# Patient Record
Sex: Male | Born: 2000 | Race: Black or African American | Hispanic: No | Marital: Single | Smoking: Never smoker
Health system: Southern US, Community
[De-identification: ages and names within clinical notes are randomized; demographics above are authoritative.]

## PROBLEM LIST (undated history)

## (undated) DIAGNOSIS — J40 Bronchitis, not specified as acute or chronic: Secondary | ICD-10-CM

## (undated) DIAGNOSIS — S060XAA Concussion with loss of consciousness status unknown, initial encounter: Secondary | ICD-10-CM

## (undated) DIAGNOSIS — K219 Gastro-esophageal reflux disease without esophagitis: Secondary | ICD-10-CM

## (undated) DIAGNOSIS — S060X9A Concussion with loss of consciousness of unspecified duration, initial encounter: Secondary | ICD-10-CM

## (undated) HISTORY — PX: TONSILLECTOMY: SUR1361

## (undated) HISTORY — PX: ADENOIDECTOMY: SUR15

---
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
## 2021-03-14 ENCOUNTER — Encounter (HOSPITAL_BASED_OUTPATIENT_CLINIC_OR_DEPARTMENT_OTHER): Payer: Self-pay | Admitting: *Deleted

## 2021-03-14 ENCOUNTER — Emergency Department (HOSPITAL_BASED_OUTPATIENT_CLINIC_OR_DEPARTMENT_OTHER)
Admission: EM | Admit: 2021-03-14 | Discharge: 2021-03-14 | Disposition: A | Discharge status: Home / Self Care | Payer: Medicaid Other | Attending: Emergency Medicine | Admitting: Emergency Medicine

## 2021-03-14 ENCOUNTER — Other Ambulatory Visit: Payer: Self-pay

## 2021-03-14 DIAGNOSIS — H6122 Impacted cerumen, left ear: Secondary | ICD-10-CM | POA: Diagnosis not present

## 2021-03-14 DIAGNOSIS — H938X2 Other specified disorders of left ear: Secondary | ICD-10-CM | POA: Diagnosis present

## 2021-03-14 NOTE — ED Provider Notes (Signed)
[facility] EMERGENCY DEPARTMENT Provider Note   CSN: [id number] Arrival date & time: 03/14/21  2138     History Chief Complaint  Patient presents with   Hearing Problem    Darryl Holmes is a 20 y.o. male.  The history is provided by the patient. No language interpreter was used.  Ear Fullness This is a new problem. The current episode started less than 1 hour ago. The problem occurs constantly. Nothing aggravates the symptoms. Nothing relieves the symptoms. He has tried nothing for the symptoms.    Pt reports ear began feeling stopped up tonight.  Pt reports decreased hearing left ear.  Past Medical History:  Diagnosis Date   Bronchitis    Concussion    GERD (gastroesophageal reflux disease)     Patient Active Problem List   Diagnosis Date Noted   Injury of right toe 03/14/2015    Past Surgical History:  Procedure Laterality Date   ADENOIDECTOMY     TONSILLECTOMY         No family history on file.  Social History   Tobacco Use   Smoking status: Never    Passive exposure: Yes   Smokeless tobacco: Never  Vaping Use   Vaping Use: Never used  Substance Use Topics   Alcohol use: Not Currently    Alcohol/week: 0.0 standard drinks   Drug use: Not Currently    Types: Marijuana    Home Medications Prior to Admission medications   Medication Sig Start Date End Date Taking? Authorizing Provider  ARIPiprazole (ABILIFY) 5 MG tablet Take 1 tablet (5 mg total) by mouth daily. 06/29/20   Niel Hummer, MD  cephALEXin (KEFLEX) 500 MG capsule Take 1 capsule (500 mg total) by mouth 4 (four) times daily. 12/02/20   Cathren Laine, MD  famotidine (PEPCID) 40 MG tablet Take 1 tablet (40 mg total) by mouth daily. 11/18/20   Terrilee Files, MD  ondansetron (ZOFRAN ODT) 4 MG disintegrating tablet Take 1 tablet (4 mg total) by mouth every 8 (eight) hours as needed for nausea or vomiting. 11/18/20   Terrilee Files, MD    Allergies    Patient has no known  allergies.  Review of Systems   Review of Systems  All other systems reviewed and are negative.  Physical Exam Updated Vital Signs BP 127/82 (BP Location: Right Arm)   Pulse 80   Temp 98.6 F (37 C) (Oral)   Resp 16   Ht 5\' 8"  (1.727 m)   Wt 82.1 kg   SpO2 100%   BMI 27.52 kg/m   Physical Exam Vitals reviewed.  HENT:     Left Ear: There is impacted cerumen.     Mouth/Throat:     Mouth: Mucous membranes are moist.  Cardiovascular:     Rate and Rhythm: Normal rate.  Pulmonary:     Effort: Pulmonary effort is normal.  Skin:    General: Skin is warm.  Neurological:     General: No focal deficit present.     Mental Status: He is alert.  Psychiatric:        Mood and Affect: Mood normal.    ED Results / Procedures / Treatments   Labs (all labs ordered are listed, but only abnormal results are displayed) Labs Reviewed - No data to display  EKG None  Radiology No results found.  Procedures Procedures   Medications Ordered in ED Medications - No data to display  ED Course  I have reviewed the triage  vital signs and the nursing notes.  Pertinent labs & imaging results that were available during my care of the patient were reviewed by me and considered in my medical decision making (see chart for details).    MDM Rules/Calculators/A&P                         MDM:  Ear wax irrigated out by Rn.  Pt reports relief of symptoms  Final Clinical Impression(s) / ED Diagnoses Final diagnoses:  Impacted cerumen of left ear    Rx / DC Orders ED Discharge Orders     None     An After Visit Summary was printed and given to the patient.    Elson Areas, Cordelia Poche 03/14/21 2355    Maia Plan, MD 03/17/21 [phone]
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
## 2021-05-13 ENCOUNTER — Encounter (HOSPITAL_BASED_OUTPATIENT_CLINIC_OR_DEPARTMENT_OTHER): Payer: Self-pay | Admitting: *Deleted

## 2021-05-13 ENCOUNTER — Emergency Department (HOSPITAL_BASED_OUTPATIENT_CLINIC_OR_DEPARTMENT_OTHER): Payer: Medicaid Other

## 2021-05-13 ENCOUNTER — Emergency Department (HOSPITAL_BASED_OUTPATIENT_CLINIC_OR_DEPARTMENT_OTHER)
Admission: EM | Admit: 2021-05-13 | Discharge: 2021-05-14 | Disposition: A | Discharge status: Home / Self Care | Payer: Medicaid Other | Attending: Emergency Medicine | Admitting: Emergency Medicine

## 2021-05-13 ENCOUNTER — Other Ambulatory Visit: Payer: Self-pay

## 2021-05-13 DIAGNOSIS — R112 Nausea with vomiting, unspecified: Secondary | ICD-10-CM | POA: Insufficient documentation

## 2021-05-13 DIAGNOSIS — W228XXA Striking against or struck by other objects, initial encounter: Secondary | ICD-10-CM | POA: Insufficient documentation

## 2021-05-13 DIAGNOSIS — S3022XA Contusion of scrotum and testes, initial encounter: Secondary | ICD-10-CM | POA: Diagnosis not present

## 2021-05-13 DIAGNOSIS — N451 Epididymitis: Secondary | ICD-10-CM | POA: Insufficient documentation

## 2021-05-13 DIAGNOSIS — S3994XA Unspecified injury of external genitals, initial encounter: Secondary | ICD-10-CM | POA: Diagnosis present

## 2021-05-13 MED ORDER — HYDROMORPHONE HCL 1 MG/ML IJ SOLN
0.5000 mg | Freq: Once | INTRAMUSCULAR | Status: AC
  Administered 2021-05-13: 0.5 mg via INTRAVENOUS
  Filled 2021-05-13: qty 1

## 2021-05-13 MED ORDER — SODIUM CHLORIDE 0.9 % IV BOLUS
1000.0000 mL | Freq: Once | INTRAVENOUS | Status: AC
  Administered 2021-05-13: 1000 mL via INTRAVENOUS

## 2021-05-13 MED ORDER — ONDANSETRON HCL 4 MG/2ML IJ SOLN
4.0000 mg | Freq: Once | INTRAMUSCULAR | Status: AC
  Administered 2021-05-13: 4 mg via INTRAVENOUS
  Filled 2021-05-13: qty 2
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
## 2021-05-13 NOTE — ED Provider Notes (Signed)
[facility] EMERGENCY DEPARTMENT Provider Note   CSN: [id number] Arrival date & time: 05/13/21  2051     History Chief Complaint  Patient presents with   Testicle Injury    Darryl Holmes is a 20 y.o. male.  Patient presents to the emergency department for evaluation of left sided testicular pain and swelling starting about 12 hours ago.  Patient states that he was struck with a "trash stick" that is used to pick up garbage.  He states that he thinks he "fractured" his testicle.  Patient has pain and nausea with vomiting.  No treatments prior to arrival.  Denies other injury.  He has urinated without difficulty. The onset of this condition was acute. The course is constant. Aggravating factors: none. Alleviating factors: none.        Past Medical History:  Diagnosis Date   Bronchitis    Concussion    GERD (gastroesophageal reflux disease)     Patient Active Problem List   Diagnosis Date Noted   Injury of right toe 03/14/2015    Past Surgical History:  Procedure Laterality Date   ADENOIDECTOMY     TONSILLECTOMY         History reviewed. No pertinent family history.  Social History   Tobacco Use   Smoking status: Never    Passive exposure: Yes   Smokeless tobacco: Never  Vaping Use   Vaping Use: Never used  Substance Use Topics   Alcohol use: Not Currently    Alcohol/week: 0.0 standard drinks   Drug use: Not Currently    Types: Marijuana    Home Medications Prior to Admission medications   Medication Sig Start Date End Date Taking? Authorizing Provider  ARIPiprazole (ABILIFY) 5 MG tablet Take 1 tablet (5 mg total) by mouth daily. 06/29/20   Niel Hummer, MD  cephALEXin (KEFLEX) 500 MG capsule Take 1 capsule (500 mg total) by mouth 4 (four) times daily. 12/02/20   Cathren Laine, MD  famotidine (PEPCID) 40 MG tablet Take 1 tablet (40 mg total) by mouth daily. 11/18/20   Terrilee Files, MD  ondansetron (ZOFRAN ODT) 4 MG disintegrating tablet Take 1  tablet (4 mg total) by mouth every 8 (eight) hours as needed for nausea or vomiting. 11/18/20   Terrilee Files, MD    Allergies    Patient has no known allergies.  Review of Systems   Review of Systems  Constitutional:  Negative for fever.  HENT:  Negative for rhinorrhea and sore throat.   Eyes:  Negative for redness.  Respiratory:  Negative for cough.   Cardiovascular:  Negative for chest pain.  Gastrointestinal:  Positive for nausea and vomiting. Negative for abdominal pain and diarrhea.  Genitourinary:  Positive for scrotal swelling and testicular pain. Negative for dysuria and hematuria.  Musculoskeletal:  Negative for myalgias.  Skin:  Negative for rash.  Neurological:  Negative for headaches.   Physical Exam Updated Vital Signs BP (!) 153/94 (BP Location: Right Arm)   Pulse 84   Temp 99.3 F (37.4 C) (Oral)   Resp 16   Ht 5\' 7"  (1.702 m)   Wt 78.5 kg   SpO2 100%   BMI 27.10 kg/m   Physical Exam Vitals and nursing note reviewed.  Constitutional:      General: He is in acute distress.     Appearance: He is well-developed.     Comments: Patient actively vomiting during exam  HENT:     Head: Normocephalic and atraumatic.  Eyes:  General:        Right eye: No discharge.        Left eye: No discharge.     Conjunctiva/sclera: Conjunctivae normal.  Cardiovascular:     Rate and Rhythm: Normal rate and regular rhythm.     Heart sounds: Normal heart sounds.  Pulmonary:     Effort: Pulmonary effort is normal.     Breath sounds: Normal breath sounds.  Abdominal:     Palpations: Abdomen is soft.     Tenderness: There is no abdominal tenderness.  Genitourinary:    Penis: Uncircumcised.      Testes:        Right: Tenderness or swelling not present.        Left: Tenderness and swelling present.  Musculoskeletal:     Cervical back: Normal range of motion and neck supple.  Skin:    General: Skin is warm and dry.  Neurological:     Mental Status: He is alert.     ED Results / Procedures / Treatments   Labs (all labs ordered are listed, but only abnormal results are displayed) Labs Reviewed  URINALYSIS, ROUTINE W REFLEX MICROSCOPIC    EKG None  Radiology US SCROTUM W/DOPPLER  Result Date: 05/13/2021 CLINICAL DATA:  Scrotal injury EXAM: SCROTAL ULTRASOUND DOPPLER ULTRASOUND OF THE TESTICLES TECHNIQUE: Complete ultrasound examination of the testicles, epididymis, and other scrotal structures was performed. Color and spectral Doppler ultrasound were also utilized to evaluate blood flow to the testicles. COMPARISON:  None. FINDINGS: Right testicle Measurements: 4.4 x 2 x 2.5 cm. No mass. Microlithiasis visualized. Left testicle Measurements: 4.5 x 2.2 x 2.6 cm. No mass. Microlithiasis visualized. Right epididymis:  Normal in size and appearance. Left epididymis: Enlarged heterogeneous epididymis with associated increased vascularity. Hydrocele:  None visualized. Varicocele:  None visualized. Pulsed Doppler interrogation of both testes demonstrates normal low resistance arterial and venous waveforms bilaterally. IMPRESSION: 1. Findings suggestive of left epididymitis.  No abscess formation. 2. No definite testicular trauma. 3. 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 Monthly testicular self-examination and annual physical exams are considered appropriate surveillance. If patient has other risk factors for testicular carcinoma, then referral to Urology should be considered. (Reference: DeCastro, et al.: A 5-Year Follow up Study of Asymptomatic Men with Testicular Microlithiasis. J Urol 2008; 179:1420-1423.) Electronically Signed   By: Tish Frederickson M.D.   On: 05/13/2021 22:19    Procedures Procedures   Medications Ordered in ED Medications  ondansetron (ZOFRAN) injection  4 mg (has no administration in time range)  HYDROmorphone (DILAUDID) injection 0.5 mg (has no administration in time range)  sodium chloride 0.9 % bolus 1,000 mL (has no administration in time range)    ED Course  I have reviewed the triage vital signs and the nursing notes.  Pertinent labs & imaging results that were available during my care of the patient were reviewed by me and considered in my medical decision making (see chart for details).  Patient seen and examined. Work-up initiated. Medications ordered. Actively vomiting.   Vital signs reviewed and are as follows: BP (!) 153/94 (BP Location: Right Arm)   Pulse 84   Temp 99.3 F (37.4 C) (Oral)   Resp 16   Ht 5\' 7"  (1.702 m)   Wt 78.5 kg   SpO2 100%   BMI 27.10 kg/m  Pt receiving IV fluids.  Appears more comfortable now.  Will need p.o. challenge.  Plan for discharged home with urology follow-up, ODT Zofran, ibuprofen.  Patient urged to return with worsening symptoms or other concerns. Patient verbalized understanding and agrees with plan.    MDM Rules/Calculators/A&P                           Patient with scrotal injury --likely traumatic epididymitis.  Pain uncontrolled on arrival with active vomiting.  Ultrasound did not demonstrate torsion or large hematoma.  He does have left epididymal swelling, likely posttraumatic.  Plan for discharged home with follow-up after symptoms are controlled.    Final Clinical Impression(s) / ED Diagnoses Final diagnoses:  Contusion of scrotum and testes, initial encounter  Epididymitis    Rx / DC Orders ED Discharge Orders          Ordered    ibuprofen (ADVIL) 800 MG tablet  3 times daily        05/14/21 0022    ondansetron (ZOFRAN ODT) 4 MG disintegrating tablet  Every 8 hours PRN        05/14/21 0022    Ambulate with assistance  Status:  Canceled        05/13/21 2359             Renne Crigler, PA-C 05/14/21 0024    Vanetta Mulders, MD 05/17/21 2304

## 2021-05-13 NOTE — ED Notes (Signed)
Pt is aware he needs a urine sample. He has a urine cup.

## 2021-05-13 NOTE — ED Triage Notes (Signed)
C/o testicle injury x 12 hrs ago c/o left testicle swelling and pain  also c/o nausea

## 2021-05-13 NOTE — Discharge Instructions (Signed)
Please read and follow all provided instructions.  Your diagnoses today include:  1. Contusion of scrotum and testes, initial encounter    Tests performed today include: Ultrasound - shows swelling of the left epididymitis, no other major problems Vital signs. See below for your results today.   Home care instructions:  Follow any educational materials contained in this packet.  BE VERY CAREFUL not to take multiple medicines containing Tylenol (also called acetaminophen). Doing so can lead to an overdose which can damage your liver and cause liver failure and possibly death.   Follow-up instructions: Please follow-up with your primary care provider or the urologist listed in the next 3 days for further evaluation of your symptoms.   Return instructions:  Please return to the Emergency Department if you experience worsening symptoms.  Please return if you have any other emergent concerns.  Additional Information:  Your vital signs today were: BP (!) 153/94 (BP Location: Right Arm)   Pulse 84   Temp 99.3 F (37.4 C) (Oral)   Resp 16   Ht 5\' 7"  (1.702 m)   Wt 78.5 kg   SpO2 100%   BMI 27.10 kg/m  If your blood pressure (BP) was elevated above 135/85 this visit, please have this repeated by your doctor within one month. --------------

## 2021-05-14 MED ORDER — KETOROLAC TROMETHAMINE 60 MG/2ML IM SOLN
30.0000 mg | Freq: Once | INTRAMUSCULAR | Status: DC
Start: 2021-05-14 — End: 2021-05-14
  Filled 2021-05-14: qty 2

## 2021-05-14 MED ORDER — ONDANSETRON 4 MG PO TBDP
4.0000 mg | ORAL_TABLET | Freq: Once | ORAL | Status: AC
  Administered 2021-05-14: 4 mg via ORAL
  Filled 2021-05-14: qty 1

## 2021-05-14 MED ORDER — IBUPROFEN 800 MG PO TABS: 800.0000 mg | ORAL_TABLET | Freq: Three times a day (TID) | ORAL | 0 refills | Status: AC

## 2021-05-14 MED ORDER — ONDANSETRON 4 MG PO TBDP: 4.0000 mg | ORAL_TABLET | Freq: Three times a day (TID) | ORAL | 0 refills | Status: AC | PRN

## 2021-05-24 ENCOUNTER — Encounter (HOSPITAL_BASED_OUTPATIENT_CLINIC_OR_DEPARTMENT_OTHER): Payer: Self-pay

## 2021-05-24 ENCOUNTER — Other Ambulatory Visit: Payer: Self-pay

## 2021-05-24 DIAGNOSIS — R112 Nausea with vomiting, unspecified: Secondary | ICD-10-CM | POA: Diagnosis not present

## 2021-05-24 DIAGNOSIS — R1084 Generalized abdominal pain: Secondary | ICD-10-CM | POA: Insufficient documentation

## 2021-05-24 DIAGNOSIS — D72829 Elevated white blood cell count, unspecified: Secondary | ICD-10-CM | POA: Diagnosis not present

## 2021-05-24 DIAGNOSIS — Z7722 Contact with and (suspected) exposure to environmental tobacco smoke (acute) (chronic): Secondary | ICD-10-CM | POA: Insufficient documentation

## 2021-05-24 NOTE — ED Triage Notes (Signed)
Pt c/o abd pain n/v x 3 days-NAD-steady gait

## 2021-05-25 ENCOUNTER — Emergency Department (HOSPITAL_BASED_OUTPATIENT_CLINIC_OR_DEPARTMENT_OTHER)
Admission: EM | Admit: 2021-05-25 | Discharge: 2021-05-25 | Disposition: A | Discharge status: Home / Self Care | Payer: Medicaid Other | Attending: Emergency Medicine | Admitting: Emergency Medicine

## 2021-05-25 DIAGNOSIS — R112 Nausea with vomiting, unspecified: Secondary | ICD-10-CM

## 2021-05-25 DIAGNOSIS — R1084 Generalized abdominal pain: Secondary | ICD-10-CM

## 2021-05-25 LAB — COMPREHENSIVE METABOLIC PANEL
ALT: 23 U/L (ref 0–44)
AST: 17 U/L (ref 15–41)
Albumin: 4.3 g/dL (ref 3.5–5.0)
Alkaline Phosphatase: 85 U/L (ref 38–126)
Anion gap: 13 (ref 5–15)
BUN: 16 mg/dL (ref 6–20)
CO2: 26 mmol/L (ref 22–32)
Calcium: 9.6 mg/dL (ref 8.9–10.3)
Chloride: 96 mmol/L — ABNORMAL LOW (ref 98–111)
Creatinine, Ser: 0.82 mg/dL (ref 0.61–1.24)
GFR, Estimated: >60 mL/min (ref >60)
Glucose, Bld: 99 mg/dL (ref 70–99)
Potassium: 3.6 mmol/L (ref 3.5–5.1)
Sodium: 135 mmol/L (ref 135–145)
Total Bilirubin: 0.7 mg/dL (ref 0.3–1.2)
Total Protein: 8.8 g/dL — ABNORMAL HIGH (ref 6.5–8.1)

## 2021-05-25 LAB — CBC
HCT: 47.5 % (ref 39.0–52.0)
Hemoglobin: 16 g/dL (ref 13.0–17.0)
MCH: 30.5 pg (ref 26.0–34.0)
MCHC: 33.7 g/dL (ref 30.0–36.0)
MCV: 90.6 fL (ref 80.0–100.0)
Platelets: 423 10*3/uL — ABNORMAL HIGH (ref 150–400)
RBC: 5.24 MIL/uL (ref 4.22–5.81)
RDW: 12 % (ref 11.5–15.5)
WBC: 12.5 10*3/uL — ABNORMAL HIGH (ref 4.0–10.5)
nRBC: 0 % (ref 0.0–0.2)

## 2021-05-25 LAB — LIPASE, BLOOD: Lipase: 40 U/L (ref 11–51)

## 2021-05-25 MED ORDER — LIDOCAINE VISCOUS HCL 2 % MT SOLN
15.0000 mL | Freq: Once | OROMUCOSAL | Status: AC
  Administered 2021-05-25: 15 mL via ORAL
  Filled 2021-05-25: qty 15

## 2021-05-25 MED ORDER — ALUM & MAG HYDROXIDE-SIMETH 200-200-20 MG/5ML PO SUSP
30.0000 mL | Freq: Once | ORAL | Status: AC
  Administered 2021-05-25: 30 mL via ORAL
  Filled 2021-05-25: qty 30

## 2021-05-25 MED ORDER — HYOSCYAMINE SULFATE 0.125 MG SL SUBL
0.2500 mg | SUBLINGUAL_TABLET | Freq: Once | SUBLINGUAL | Status: AC
  Administered 2021-05-25: 0.25 mg via SUBLINGUAL
  Filled 2021-05-25: qty 2

## 2021-05-25 NOTE — ED Provider Notes (Signed)
[facility] EMERGENCY DEPARTMENT Provider Note  CSN: [id number] Arrival date & time: 05/24/21 2138  Chief Complaint(s) Abdominal Pain  HPI Darryl Holmes is a 20 y.o. male with a past medical history listed below who presents to the emergency department with 2 to 3 days of intermittent generalized abdominal pain associated with nausea and nonbloody nonbilious emesis.  Similar episodes in the past.  No suspicious food intake.  He denies any fevers or chills.  No coughing or congestion.  No known suspicious food intake.  No diarrhea.  No chest pain.  No constipation.  Exacerbated with certain food consumption. Reports that he quit smoking marijuana last month. Denies any alcohol use.. No other illicit drug use.  HPI  Past Medical History Past Medical History:  Diagnosis Date   Bronchitis    Concussion    GERD (gastroesophageal reflux disease)    Patient Active Problem List   Diagnosis Date Noted   Injury of right toe 03/14/2015   Home Medication(s) Prior to Admission medications   Medication Sig Start Date End Date Taking? Authorizing Provider  ARIPiprazole (ABILIFY) 5 MG tablet Take 1 tablet (5 mg total) by mouth daily. 06/29/20   Niel Hummer, MD  ibuprofen (ADVIL) 800 MG tablet Take 1 tablet (800 mg total) by mouth 3 (three) times daily. 05/14/21   Renne Crigler, PA-C  ondansetron (ZOFRAN ODT) 4 MG disintegrating tablet Take 1 tablet (4 mg total) by mouth every 8 (eight) hours as needed for nausea or vomiting. 05/14/21   Renne Crigler, PA-C                                                                                                                                    Past Surgical History Past Surgical History:  Procedure Laterality Date   ADENOIDECTOMY     TONSILLECTOMY     Family History No family history on file.  Social History Social History   Tobacco Use   Smoking status: Never    Passive exposure: Yes   Smokeless tobacco: Never  Vaping Use    Vaping Use: Never used  Substance Use Topics   Alcohol use: Not Currently    Alcohol/week: 0.0 standard drinks   Drug use: Not Currently    Types: Marijuana   Allergies Patient has no known allergies.  Review of Systems Review of Systems All other systems are reviewed and are negative for acute change except as noted in the HPI  Physical Exam Vital Signs  I have reviewed the triage vital signs BP 138/77   Pulse 87   Temp 98.8 F (37.1 C) (Oral)   Resp 18   Ht 5\' 8"  (1.727 m)   Wt 74.4 kg   SpO2 99%   BMI 24.94 kg/m   Physical Exam Vitals reviewed.  Constitutional:      General: He is not in acute distress.    Appearance: He is well-developed. He is not  diaphoretic.  HENT:     Head: Normocephalic and atraumatic.     Right Ear: External ear normal.     Left Ear: External ear normal.     Nose: Nose normal.     Mouth/Throat:     Mouth: Mucous membranes are moist.  Eyes:     General: No scleral icterus.    Conjunctiva/sclera: Conjunctivae normal.  Neck:     Trachea: Phonation normal.  Cardiovascular:     Rate and Rhythm: Normal rate and regular rhythm.  Pulmonary:     Effort: Pulmonary effort is normal. No respiratory distress.     Breath sounds: No stridor.  Abdominal:     General: There is no distension.     Tenderness: There is abdominal tenderness in the right lower quadrant, periumbilical area, suprapubic area and left lower quadrant. There is no guarding or rebound. Negative signs include Murphy's sign, Rovsing's sign, McBurney's sign, psoas sign and obturator sign.  Musculoskeletal:        General: Normal range of motion.     Cervical back: Normal range of motion.  Neurological:     Mental Status: He is alert and oriented to person, place, and time.  Psychiatric:        Behavior: Behavior normal.    ED Results and Treatments Labs (all labs ordered are listed, but only abnormal results are displayed) Labs Reviewed  COMPREHENSIVE METABOLIC PANEL -  Abnormal; Notable for the following components:      Result Value   Chloride 96 (*)    Total Protein 8.8 (*)    All other components within normal limits  CBC - Abnormal; Notable for the following components:   WBC 12.5 (*)    Platelets 423 (*)    All other components within normal limits  LIPASE, BLOOD  URINALYSIS, ROUTINE W REFLEX MICROSCOPIC                                                                                                                         EKG  EKG Interpretation  Date/Time:    Ventricular Rate:    PR Interval:    QRS Duration:   QT Interval:    QTC Calculation:   R Axis:     Text Interpretation:         Radiology No results found.  Pertinent labs & imaging results that were available during my care of the patient were reviewed by me and considered in my medical decision making (see MDM for details).  Medications Ordered in ED Medications  alum & mag hydroxide-simeth (MAALOX/MYLANTA) 200-200-20 MG/5ML suspension 30 mL (30 mLs Oral Given 05/25/21 0302)    And  lidocaine (XYLOCAINE) 2 % viscous mouth solution 15 mL (15 mLs Oral Given 05/25/21 0303)  hyoscyamine (LEVSIN SL) SL tablet 0.25 mg (0.25 mg Sublingual Given 05/25/21 0302)  Procedures Procedures  (including critical care time)  Medical Decision Making / ED Course I have reviewed the nursing notes for this encounter and the patient's prior records (if available in EHR or on provided paperwork).  Darryl Holmes was evaluated in Emergency Department on 05/25/2021 for the symptoms described in the history of present illness. He was evaluated in the context of the global COVID-19 pandemic, which necessitated consideration that the patient might be at risk for infection with the SARS-CoV-2 virus that causes COVID-19. Institutional protocols and algorithms that pertain to  the evaluation of patients at risk for COVID-19 are in a state of rapid change based on information released by regulatory bodies including the CDC and federal and state organizations. These policies and algorithms were followed during the patient's care in the ED.     Patient here for several days of abdominal pain with nausea vomiting Similar presentations in the past. Patient with discomfort of palpation without evidence of peritonitis. Screening labs obtain. Will treat symptomatically.  Pertinent labs & imaging results that were available during my care of the patient were reviewed by me and considered in my medical decision making:  Labs with mild leukocytosis.  No anemia. No significant electrolyte derangements or renal sufficiency. No evidence of bili obstruction or pancreatitis. Patient treated symptomatically with GI cocktail provided significant relief in his symptoms. Able to tolerate oral intake. Repeat abdominal exam benign. Low suspicion for serious intra-abdominal inflammatory/infectious process/bowel obstruction requiring imaging at this time.  Final Clinical Impression(s) / ED Diagnoses Final diagnoses:  Nausea and vomiting in adult  Generalized abdominal pain   The patient appears reasonably screened and/or stabilized for discharge and I doubt any other medical condition or other [facility] requiring further screening, evaluation, or treatment in the ED at this time prior to discharge. Safe for discharge with strict return precautions.  Disposition: Discharge  Condition: Good  I have discussed the results, Dx and Tx plan with the patient/family who expressed understanding and agree(s) with the plan. Discharge instructions discussed at length. The patient/family was given strict return precautions who verbalized understanding of the instructions. No further questions at time of discharge.    ED Discharge Orders     None         Follow Up: [facility] [id number] [place] [address] [phone]  Call  to schedule an appointment for close follow up    This chart was dictated using voice recognition software.  Despite best efforts to proofread,  errors can occur which can change the documentation meaning.    Nira Conn, MD 05/25/21 [phone]

## 2021-05-25 NOTE — ED Notes (Signed)
Pt still unable to provide urine specimen 

## 2021-07-05 IMAGING — CT CT ABD-PELV W/ CM
2 of 4 series · 16 of 46 positions shown, 18 images · IV contrast (omnipaque)
Comparison: 06/05/2015

CLINICAL DATA: 19-year-old male with acute RIGHT abdominal and
pelvic pain.

EXAM:
CT ABDOMEN AND PELVIS WITH CONTRAST
TECHNIQUE: Multidetector CT imaging of the abdomen and pelvis was performed
using the standard protocol following bolus administration of
intravenous contrast.
CONTRAST:  100mL OMNIPAQUE IOHEXOL 300 MG/ML  SOLN

[Series 2: axial st · axial · 0.71mm/px · z∈[+692,+1092]mm · 13 of 88 slices shown, 15 images]
[im 4/88  soft-tissue]
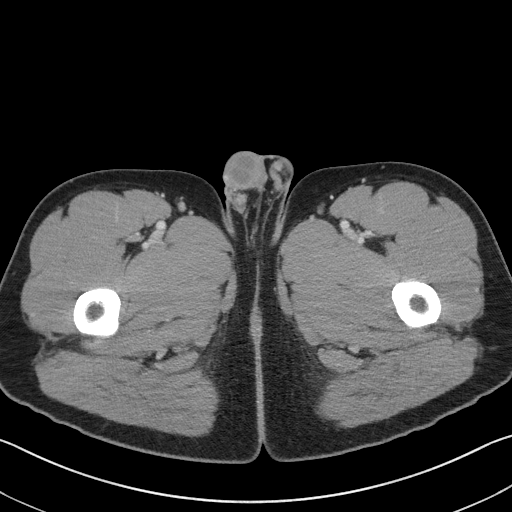
[im 4/88  bone]
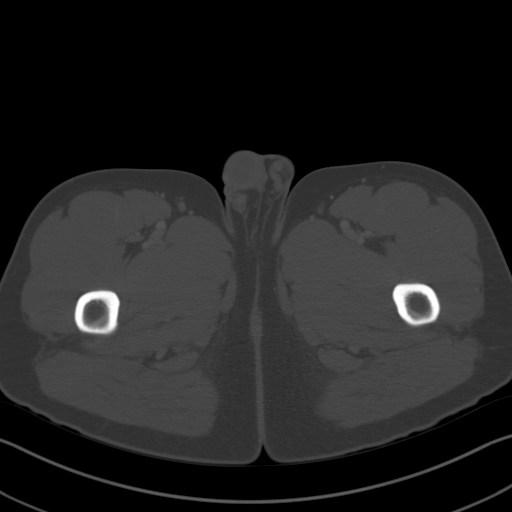
[im 12/88  soft-tissue]
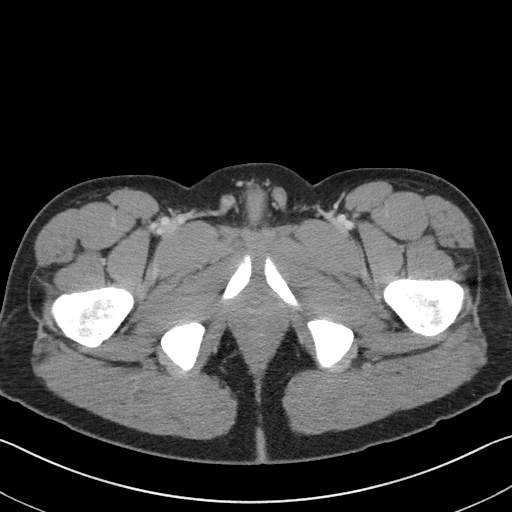
[im 20/88  soft-tissue]
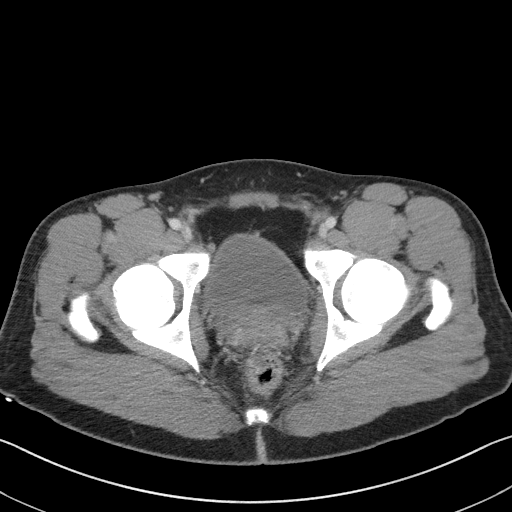
[im 24/88  soft-tissue]
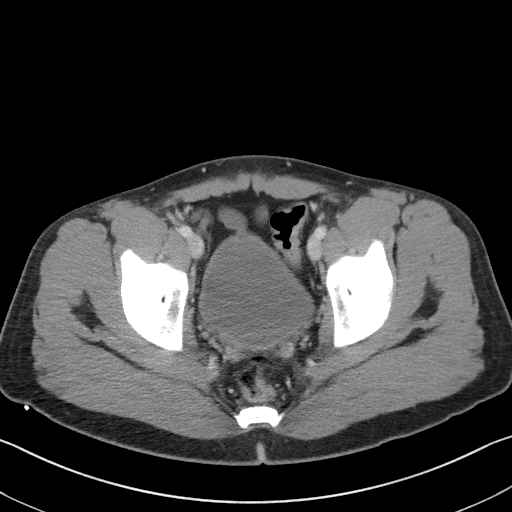
[im 32/88  soft-tissue]
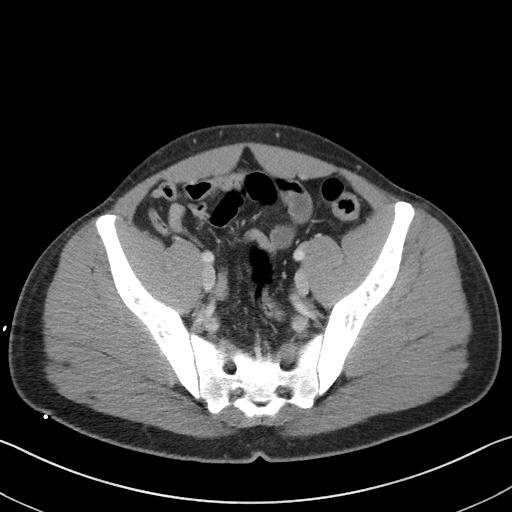
[im 36/88  soft-tissue]
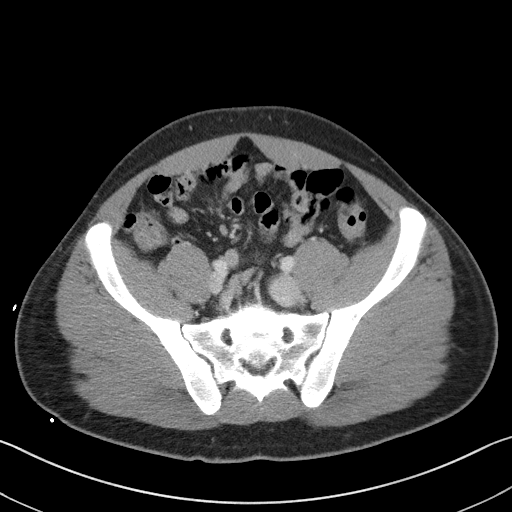
[im 44/88  soft-tissue]
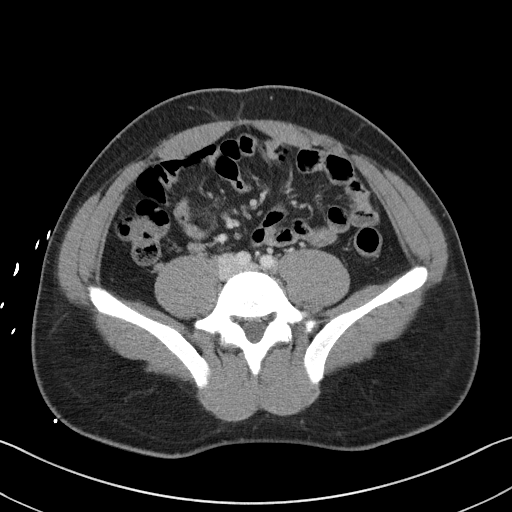
[im 52/88  soft-tissue]
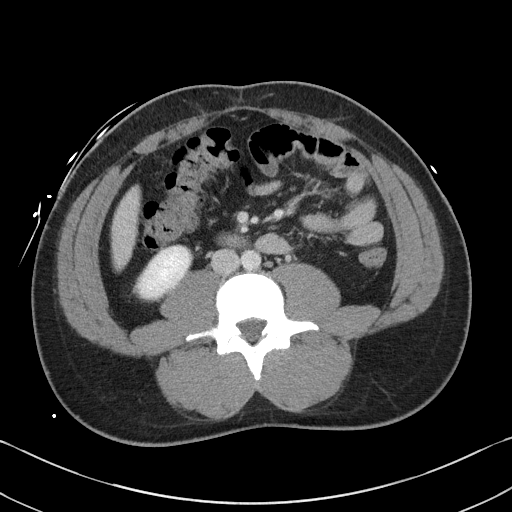
[im 56/88  soft-tissue]
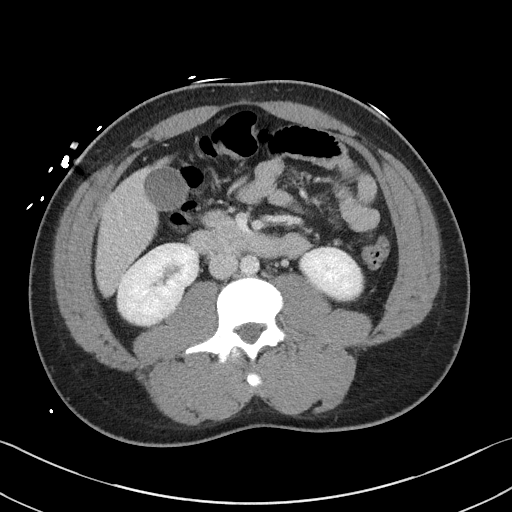
[im 56/88  bone]
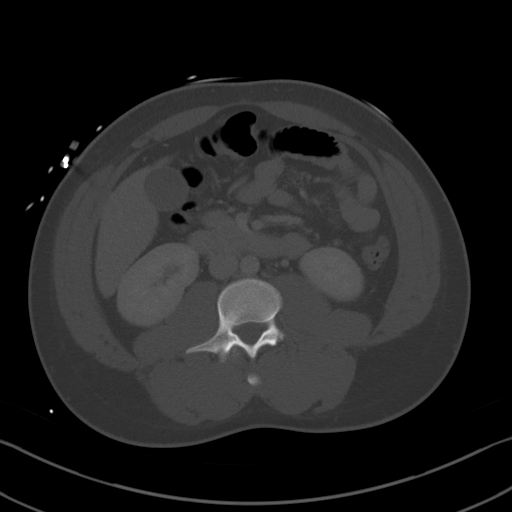
[im 64/88  soft-tissue]
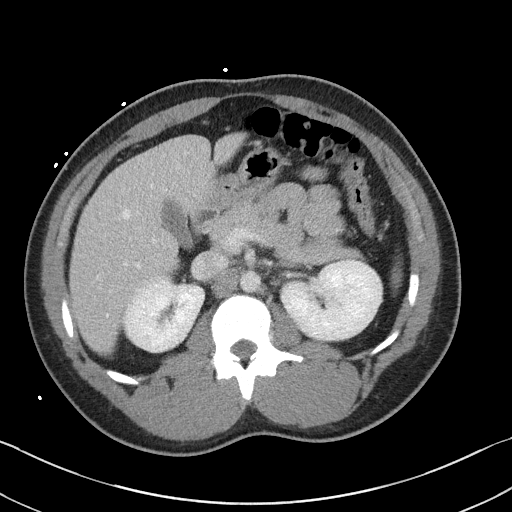
[im 68/88  soft-tissue]
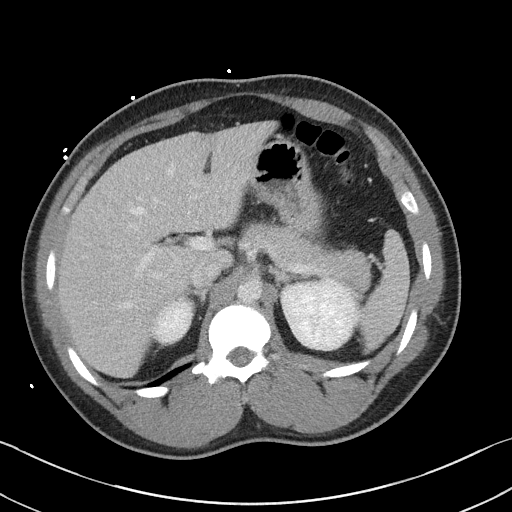
[im 76/88  soft-tissue]
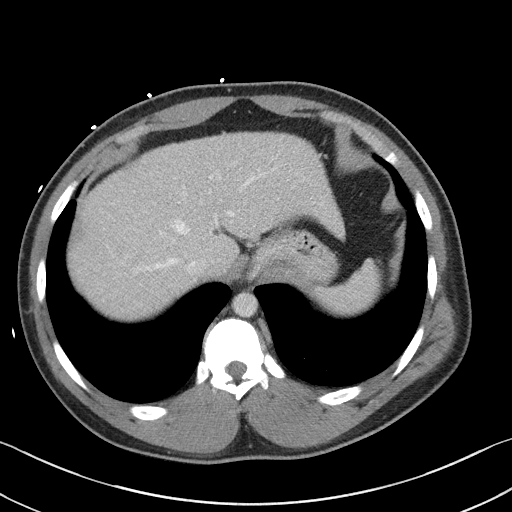
[im 84/88  soft-tissue]
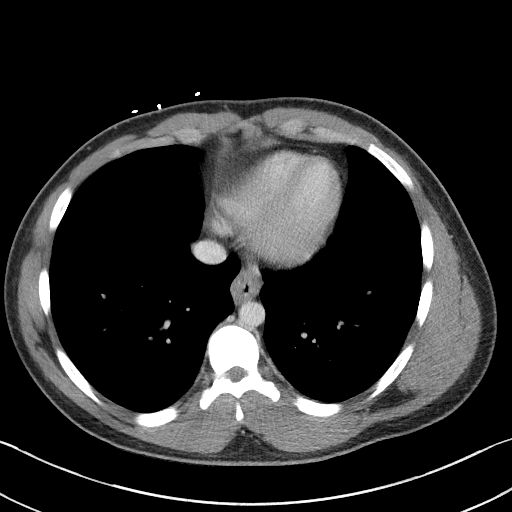

[Series 5: coronal st · coronal · 0.64mm/px · 3 of 98 slices shown]
[im 33/98  soft-tissue]
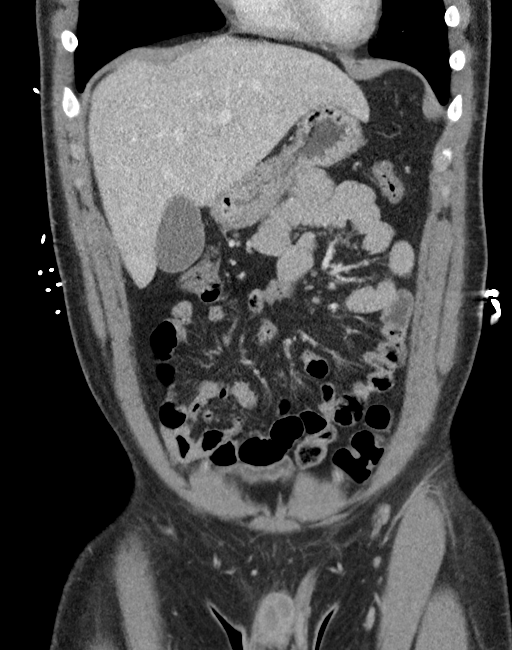
[im 44/98  soft-tissue]
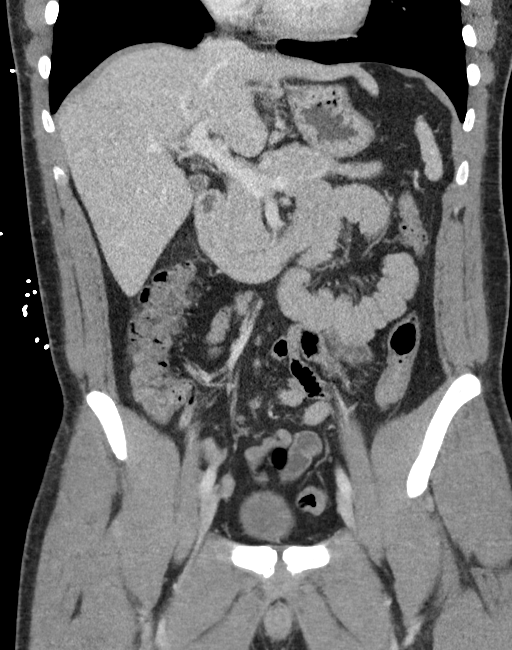
[im 54/98  soft-tissue]
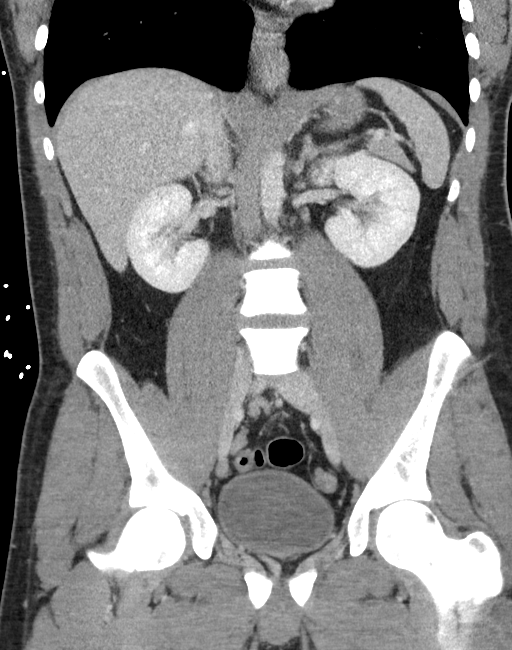

[16 of 46 positions shown; findings below may reference images not displayed]

FINDINGS: Lower chest: Unremarkable

Hepatobiliary: The liver and gallbladder are unremarkable. No
biliary dilatation.

Pancreas: Unremarkable

Spleen: Unremarkable

Adrenals/Urinary Tract: The kidneys, adrenal glands and bladder are
unremarkable.

Stomach/Bowel: Stomach is within normal limits. Appendix appears
normal. No evidence of bowel wall thickening, distention, or
inflammatory changes.

Vascular/Lymphatic: No significant vascular findings are present. No
enlarged abdominal or pelvic lymph nodes.

Reproductive: Prostate is unremarkable.

Other: No ascites, pneumoperitoneum or focal collection. No
abdominal wall hernia identified.

Musculoskeletal: No acute or suspicious bony abnormalities are
noted.
IMPRESSION: No evidence of acute abnormality. Normal appendix.
